# Patient Record
Sex: Male | Born: 1955 | Race: White | Hispanic: No | Marital: Single | Smoking: Former smoker
Health system: Southern US, Community
[De-identification: ages and names within clinical notes are randomized; demographics above are authoritative.]

## PROBLEM LIST (undated history)

## (undated) DIAGNOSIS — G4730 Sleep apnea, unspecified: Secondary | ICD-10-CM

## (undated) DIAGNOSIS — K760 Fatty (change of) liver, not elsewhere classified: Secondary | ICD-10-CM

## (undated) DIAGNOSIS — I10 Essential (primary) hypertension: Secondary | ICD-10-CM

## (undated) DIAGNOSIS — K219 Gastro-esophageal reflux disease without esophagitis: Secondary | ICD-10-CM

## (undated) HISTORY — PX: CHOLECYSTECTOMY: SHX55

---

## 2000-09-23 ENCOUNTER — Encounter: Admission: RE | Admit: 2000-09-23 | Discharge: 2000-09-23 | Payer: Self-pay | Admitting: Internal Medicine

## 2000-09-23 ENCOUNTER — Encounter: Payer: Self-pay | Admitting: Internal Medicine

## 2002-11-30 ENCOUNTER — Emergency Department (HOSPITAL_COMMUNITY): Admission: EM | Admit: 2002-11-30 | Discharge: 2002-11-30 | Payer: Self-pay | Admitting: Emergency Medicine

## 2003-03-29 ENCOUNTER — Encounter (INDEPENDENT_AMBULATORY_CARE_PROVIDER_SITE_OTHER): Payer: Self-pay | Admitting: *Deleted

## 2003-03-29 ENCOUNTER — Ambulatory Visit (HOSPITAL_COMMUNITY): Admission: RE | Admit: 2003-03-29 | Discharge: 2003-03-29 | Payer: Self-pay | Admitting: Gastroenterology

## 2008-01-16 ENCOUNTER — Emergency Department (HOSPITAL_COMMUNITY): Admission: EM | Admit: 2008-01-16 | Discharge: 2008-01-16 | Payer: Self-pay | Admitting: Emergency Medicine
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
## 2010-03-24 ENCOUNTER — Emergency Department (HOSPITAL_COMMUNITY): Admission: EM | Admit: 2010-03-24 | Discharge: 2010-03-24 | Payer: Self-pay | Admitting: Family Medicine

## 2011-03-05 LAB — CBC
HCT: 44 % (ref 39.0–52.0)
Hemoglobin: 15.1 g/dL (ref 13.0–17.0)
MCHC: 34.4 g/dL (ref 30.0–36.0)
MCV: 90.6 fL (ref 78.0–100.0)
Platelets: 255 10*3/uL (ref 150–400)
RBC: 4.86 MIL/uL (ref 4.22–5.81)
RDW: 12.9 % (ref 11.5–15.5)
WBC: 16.6 10*3/uL — ABNORMAL HIGH (ref 4.0–10.5)

## 2011-03-05 LAB — POCT URINALYSIS DIP (DEVICE)
Bilirubin Urine: NEGATIVE
Glucose, UA: NEGATIVE mg/dL
Hgb urine dipstick: NEGATIVE
Ketones, ur: NEGATIVE mg/dL
Nitrite: NEGATIVE
Protein, ur: NEGATIVE mg/dL
Specific Gravity, Urine: 1.015 (ref 1.005–1.030)
Urobilinogen, UA: 0.2 mg/dL (ref 0.0–1.0)
pH: 6 (ref 5.0–8.0)

## 2011-03-05 LAB — POCT I-STAT, CHEM 8
HCT: 47 % (ref 39.0–52.0)
Hemoglobin: 16 g/dL (ref 13.0–17.0)
Potassium: 3.9 mEq/L (ref 3.5–5.1)
Sodium: 137 mEq/L (ref 135–145)
TCO2: 23 mmol/L (ref 0–100)

## 2011-05-02 NOTE — Op Note (Signed)
   NAME:  Samuel Savage, Samuel Savage                         ACCOUNT NO.:  [id number]   MEDICAL RECORD NO.:  [id number]                   PATIENT TYPE:  AMB   LOCATION:  ENDO                                 FACILITY:  [facility]   PHYSICIAN:  Anselmo Rod, M.D.               DATE OF BIRTH:  [birth year]   DATE OF PROCEDURE:  DATE OF DISCHARGE:                                 OPERATIVE REPORT   PROCEDURE:  Colonoscopy with biopsies.   ENDOSCOPIST:  Charna Elizabeth, M.D.   INSTRUMENT USED:  Olympus video colonoscope.   INDICATIONS FOR PROCEDURE:  Rectal bleeding in a 55 year old white male.  Rule out colonic polyps, masses, etc.   PREPROCEDURE PREPARATION:  Informed consent was procured from the patient.  The patient was fasted for eight hours prior to the procedure and prepped  with a bottle of magnesium citrate and GOLYTELY the night prior to the  procedure.   PREPROCEDURE PHYSICAL:  VITAL SIGNS:  The patient had stable vital signs.  NECK:  Supple.  CHEST:  Clear to auscultation.  CARDIAC:  S1 and S2 regular.  ABDOMEN:  Soft with normal bowel sounds.   DESCRIPTION OF PROCEDURE:  The patient was placed in the left lateral  decubitus position and sedated with 100 mg of Demerol and 10 mg of Versed  intravenously.  Once the patient was adequately sedated, maintained on low  flow oxygen and continuous cardiac monitoring, the Olympus video colonoscope  was advanced from the rectum to the cecum without difficulty.  Patchy  erythema was noted in the terminal ileum.  This was biopsied to rule out  Crohn's disease.  Small nonbleeding internal hemorrhoids were seen on  retroflexion.  The rest of the colonic mucosa appeared healthy without  lesions.  No masses, polyps or diverticulosis were noted.   IMPRESSION:  1. Patchy erythema in terminal ileum.  Biopsy to rule out inflammatory bowel     disease.  2. Small nonbleeding internal hemorrhoids.    RECOMMENDATIONS:  1. Anusol HC 2.5% suppositories have  been prescribed for the patient one     p.r. q.h.s. #30 with three refills.  2. Await pathology results.  3. Outpatient followup in the next two weeks for further recommendations.                                                Anselmo Rod, M.D.    JNM/MEDQ  D:  03/29/2003  T:  03/30/2003  Job:  [id number]   cc:   Olene Craven, M.D.  [address]  [address]  [place]  [place] [address]  Fax: [phone]

## 2011-09-18 IMAGING — CR DG ABDOMEN 1V
2 series · 2 of 2 positions shown · non-contrast
Comparison: None.

CLINICAL DATA: Left flank pain.

ABDOMEN - 1 VIEW

[view not recorded (1 of 2)]
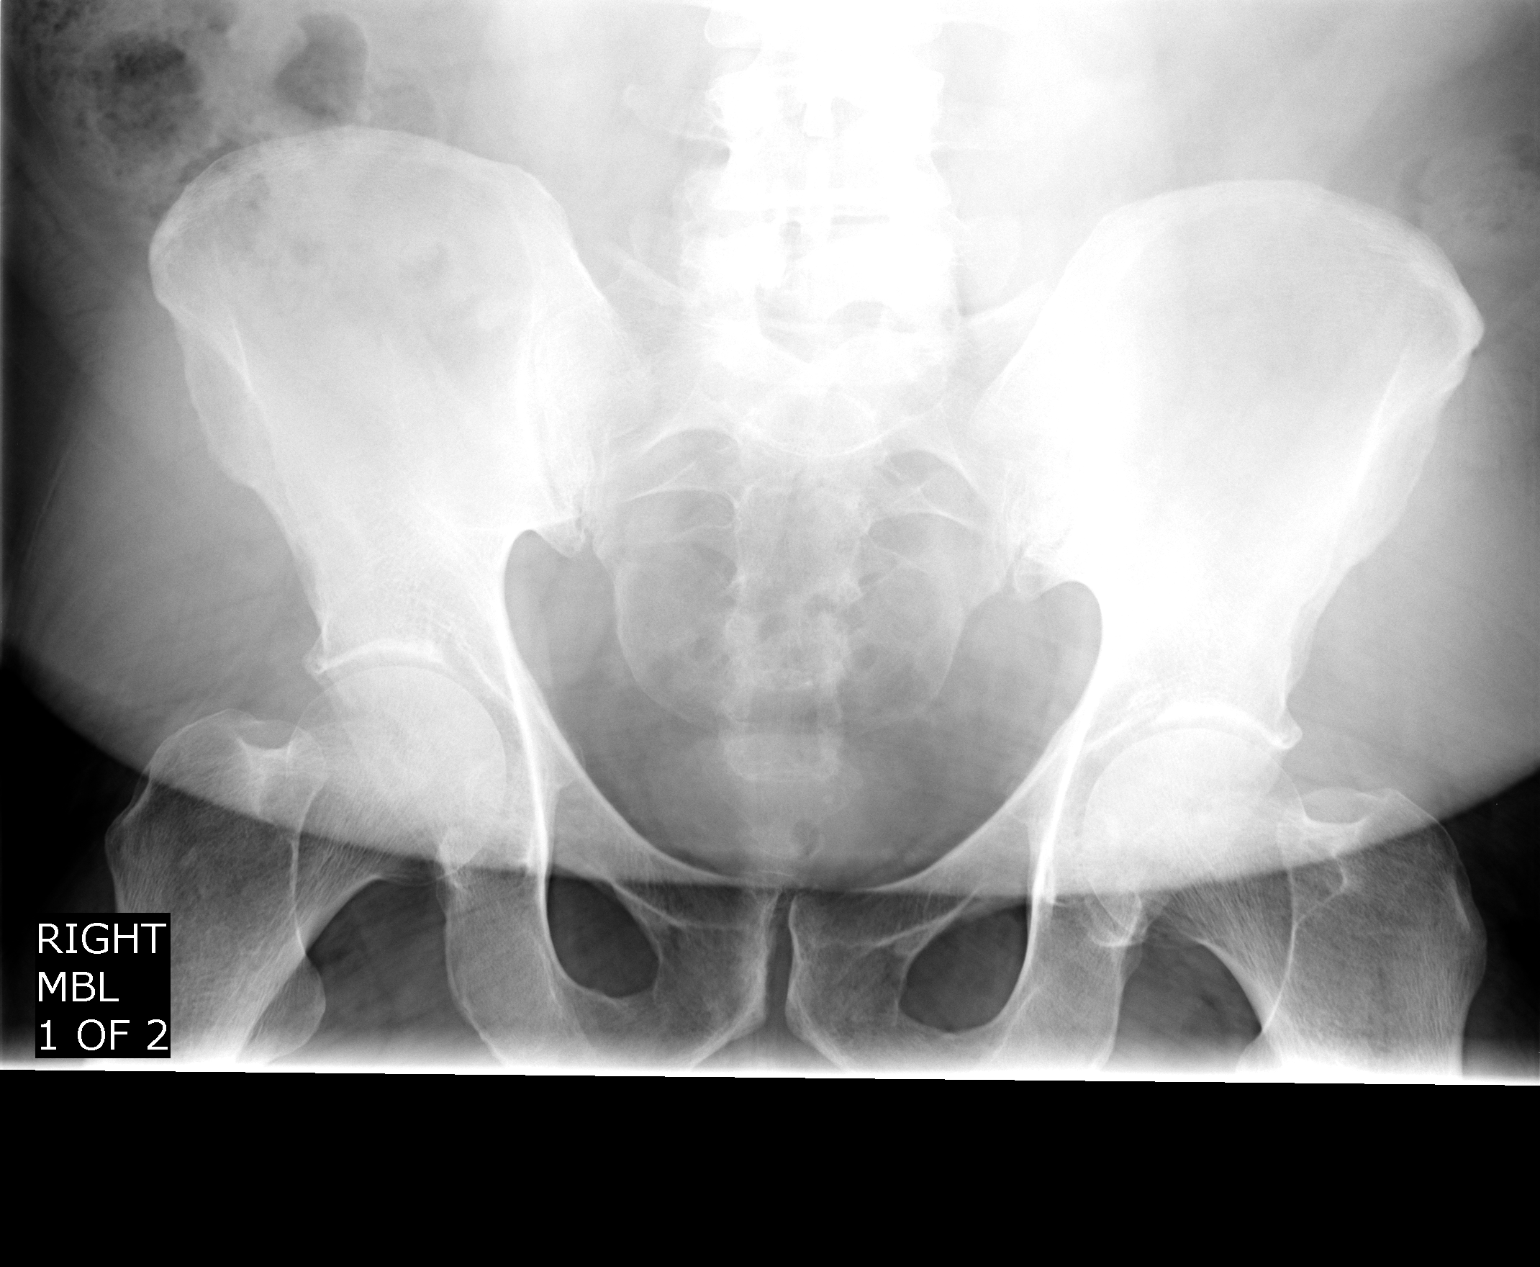

[view not recorded (2 of 2)]
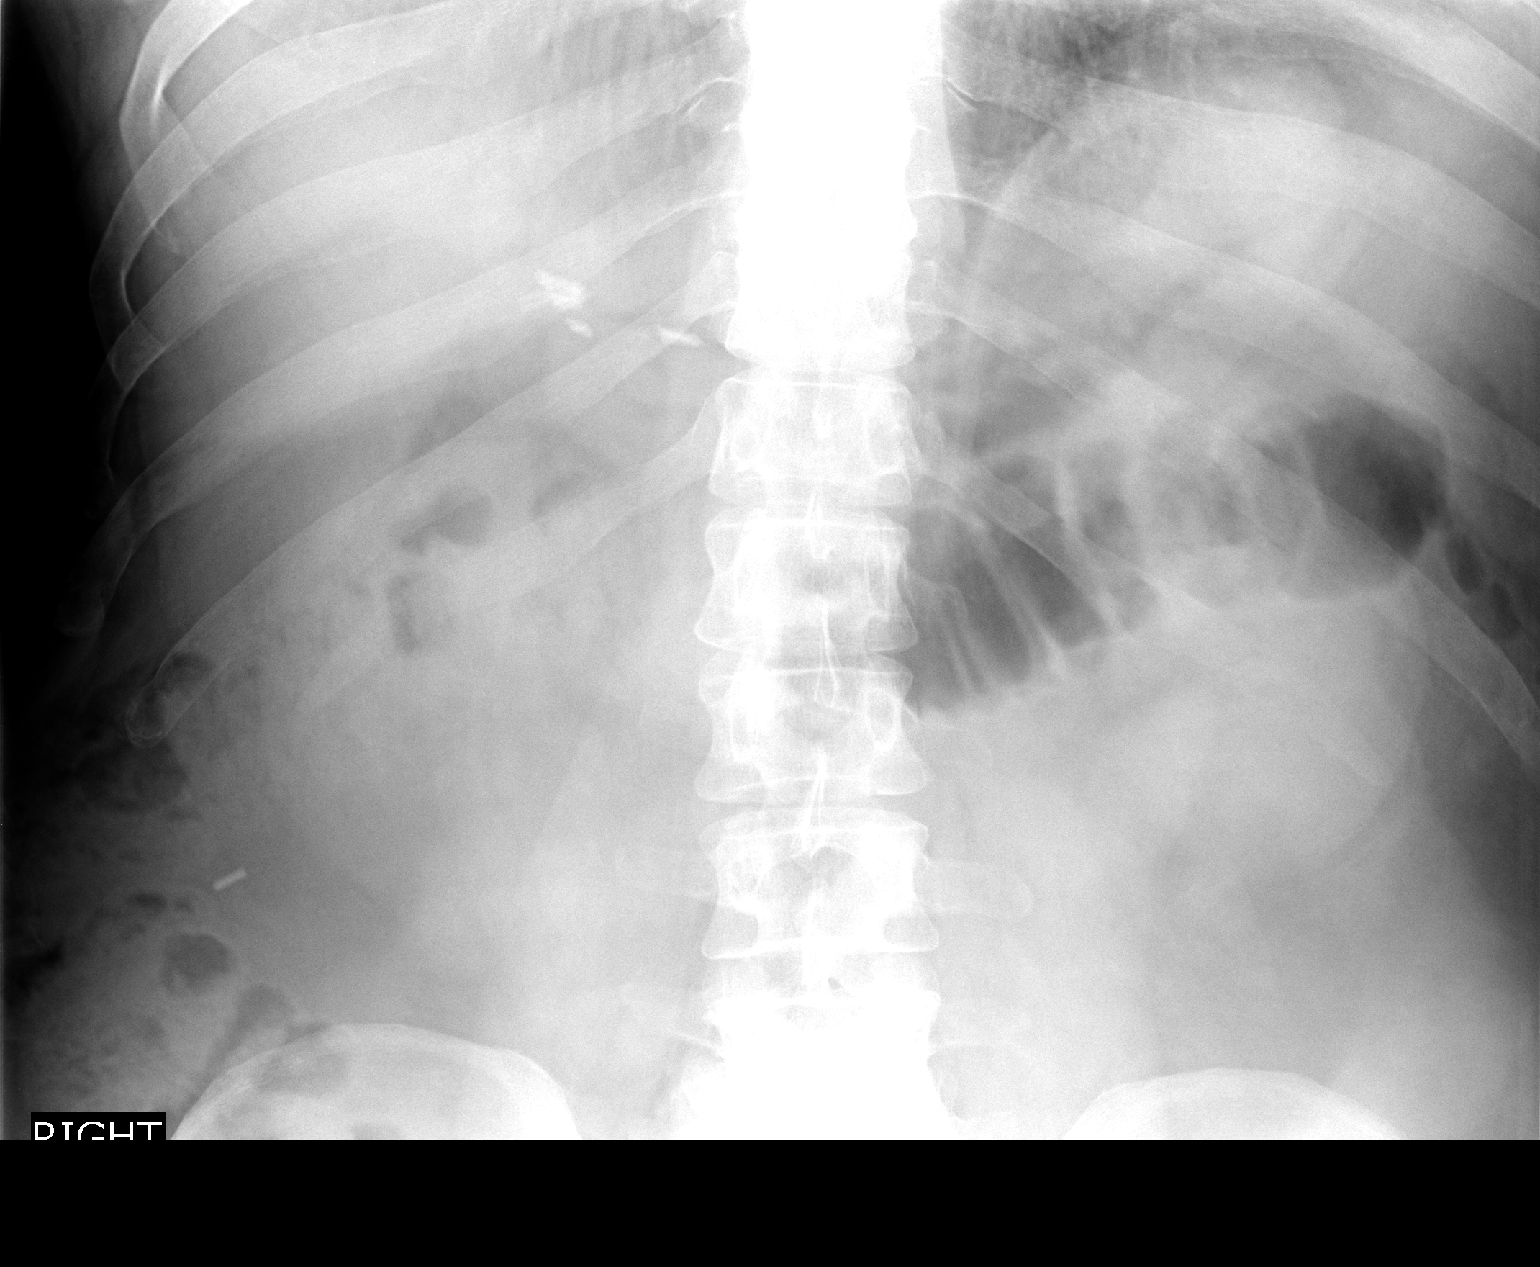

[2 of 2 positions shown; findings below may reference images not displayed]

FINDINGS: Bowel gas pattern is within normal limits.  No radiopaque
calculi identified.  Surgical clips seen from prior
cholecystectomy.
IMPRESSION: No acute findings.

## 2022-08-20 ENCOUNTER — Other Ambulatory Visit: Payer: Self-pay | Admitting: Gastroenterology

## 2022-10-03 ENCOUNTER — Encounter (HOSPITAL_COMMUNITY): Payer: Self-pay | Admitting: Gastroenterology

## 2022-10-10 ENCOUNTER — Encounter (HOSPITAL_COMMUNITY)
Admission: RE | Disposition: A | Discharge status: Home / Self Care | Payer: Self-pay | Source: Ambulatory Visit | Attending: Gastroenterology

## 2022-10-10 ENCOUNTER — Ambulatory Visit (HOSPITAL_BASED_OUTPATIENT_CLINIC_OR_DEPARTMENT_OTHER): Payer: Medicare Other | Admitting: Certified Registered"

## 2022-10-10 ENCOUNTER — Encounter (HOSPITAL_COMMUNITY): Payer: Self-pay | Admitting: Gastroenterology

## 2022-10-10 ENCOUNTER — Ambulatory Visit (HOSPITAL_COMMUNITY): Payer: Medicare Other | Admitting: Certified Registered"

## 2022-10-10 ENCOUNTER — Other Ambulatory Visit: Payer: Self-pay

## 2022-10-10 ENCOUNTER — Ambulatory Visit (HOSPITAL_COMMUNITY)
Admission: RE | Admit: 2022-10-10 | Discharge: 2022-10-10 | Disposition: A | Discharge status: Home / Self Care | Payer: Medicare Other | Source: Ambulatory Visit | Attending: Gastroenterology | Admitting: Gastroenterology

## 2022-10-10 DIAGNOSIS — G4730 Sleep apnea, unspecified: Secondary | ICD-10-CM | POA: Diagnosis not present

## 2022-10-10 DIAGNOSIS — K3189 Other diseases of stomach and duodenum: Secondary | ICD-10-CM | POA: Diagnosis not present

## 2022-10-10 DIAGNOSIS — Z87891 Personal history of nicotine dependence: Secondary | ICD-10-CM | POA: Insufficient documentation

## 2022-10-10 DIAGNOSIS — D509 Iron deficiency anemia, unspecified: Secondary | ICD-10-CM | POA: Diagnosis present

## 2022-10-10 DIAGNOSIS — I10 Essential (primary) hypertension: Secondary | ICD-10-CM | POA: Insufficient documentation

## 2022-10-10 DIAGNOSIS — K31819 Angiodysplasia of stomach and duodenum without bleeding: Secondary | ICD-10-CM

## 2022-10-10 DIAGNOSIS — Z6841 Body Mass Index (BMI) 40.0 and over, adult: Secondary | ICD-10-CM | POA: Diagnosis not present

## 2022-10-10 DIAGNOSIS — K317 Polyp of stomach and duodenum: Secondary | ICD-10-CM

## 2022-10-10 DIAGNOSIS — K5730 Diverticulosis of large intestine without perforation or abscess without bleeding: Secondary | ICD-10-CM | POA: Diagnosis not present

## 2022-10-10 DIAGNOSIS — K219 Gastro-esophageal reflux disease without esophagitis: Secondary | ICD-10-CM | POA: Diagnosis not present

## 2022-10-10 HISTORY — PX: ESOPHAGOGASTRODUODENOSCOPY (EGD) WITH PROPOFOL: SHX5813

## 2022-10-10 HISTORY — DX: Fatty (change of) liver, not elsewhere classified: K76.0

## 2022-10-10 HISTORY — PX: COLONOSCOPY WITH PROPOFOL: SHX5780

## 2022-10-10 HISTORY — DX: Essential (primary) hypertension: I10

## 2022-10-10 HISTORY — PX: BIOPSY: SHX5522

## 2022-10-10 HISTORY — DX: Gastro-esophageal reflux disease without esophagitis: K21.9

## 2022-10-10 HISTORY — DX: Sleep apnea, unspecified: G47.30

## 2022-10-10 HISTORY — PX: POLYPECTOMY: SHX5525

## 2022-10-10 SURGERY — ESOPHAGOGASTRODUODENOSCOPY (EGD) WITH PROPOFOL
Anesthesia: Monitor Anesthesia Care

## 2022-10-10 MED ORDER — PROPOFOL 500 MG/50ML IV EMUL
INTRAVENOUS | Status: DC | PRN
  Administered 2022-10-10: 100 ug/kg/min via INTRAVENOUS

## 2022-10-10 MED ORDER — LACTATED RINGERS IV SOLN
INTRAVENOUS | Status: DC
  Administered 2022-10-10: 1000 mL via INTRAVENOUS

## 2022-10-10 MED ORDER — PROPOFOL 10 MG/ML IV BOLUS
INTRAVENOUS | Status: DC | PRN
  Administered 2022-10-10: 70 mg via INTRAVENOUS

## 2022-10-10 MED ORDER — SODIUM CHLORIDE 0.9 % IV SOLN: INTRAVENOUS | Status: DC

## 2022-10-10 MED ORDER — GLYCOPYRROLATE 0.2 MG/ML IJ SOLN
INTRAMUSCULAR | Status: DC | PRN
  Administered 2022-10-10: .2 mg via INTRAVENOUS

## 2022-10-10 MED ORDER — DEXMEDETOMIDINE HCL IN NACL 200 MCG/50ML IV SOLN
INTRAVENOUS | Status: DC | PRN
  Administered 2022-10-10: 12 ug via INTRAVENOUS

## 2022-10-10 SURGICAL SUPPLY — 25 items

## 2022-10-10 NOTE — Anesthesia Postprocedure Evaluation (Signed)
Anesthesia Post Note  Patient: CARSYN TAUBMAN  Procedure(s) Performed: ESOPHAGOGASTRODUODENOSCOPY (EGD) WITH PROPOFOL COLONOSCOPY WITH PROPOFOL POLYPECTOMY BIOPSY     Patient location during evaluation: PACU Anesthesia Type: MAC Level of consciousness: awake and alert Pain management: pain level controlled Vital Signs Assessment: post-procedure vital signs reviewed and stable Respiratory status: spontaneous breathing, nonlabored ventilation, respiratory function stable and patient connected to nasal cannula oxygen Cardiovascular status: stable and blood pressure returned to baseline Postop Assessment: no apparent nausea or vomiting Anesthetic complications: no   No notable events documented.  Last Vitals:  Vitals:   10/10/22 0820 10/10/22 0830  BP: 120/81 131/78  Pulse: 78 81  Resp: 20 18  Temp:    SpO2: 97% 96%    Last Pain:  Vitals:   10/10/22 0830  TempSrc:   PainSc: 0-No pain                 Hayk Divis S

## 2022-10-10 NOTE — H&P (Signed)
Samuel Savage HPI: With routine blood work he was identified to have an iron deficiency.  His latest HGB was at 9.8 g/dL with an MCV of 73.  His iron saturation was at 8%. Additionally he was recently tested for sleep apnea and he is awaiting the results.  The patient denies any issues with melena, but he has intermittent hematochezia every 6 months.  His last colonoscopy was in 2019 and it only showed a small polyp.  Past Medical History:  Diagnosis Date   Fatty liver    GERD (gastroesophageal reflux disease)    Hypertension    Sleep apnea    cpap    Past Surgical History:  Procedure Laterality Date   CHOLECYSTECTOMY      History reviewed. No pertinent family history.  Social History:  reports that he has quit smoking. His smoking use included cigarettes. He does not have any smokeless tobacco history on file. No history on file for alcohol use and drug use.  Allergies:  Allergies  Allergen Reactions   Pneumococcal Vaccines Swelling    Patient reports swelling in arm with prevnar 13   Ivp Dye [Iodinated Contrast Media] Hives   Poison Ivy Extract Hives and Itching    Medications: Scheduled: Continuous:  sodium chloride     lactated ringers 1,000 mL (10/10/22 0659)    No results found for this or any previous visit (from the past 24 hour(s)).   No results found.  ROS:  As stated above in the HPI otherwise negative.  Blood pressure (!) 157/74, pulse 73, temperature 98.4 F (36.9 C), temperature source Tympanic, resp. rate 14, height 5\' 10"  (1.778 m), weight (!) 137.9 kg, SpO2 97 %.    PE: Gen: NAD, Alert and Oriented HEENT:  [place]/AT, EOMI Neck: Supple, no LAD Lungs: CTA Bilaterally CV: RRR without M/G/R ABD: Soft, NTND, +BS Ext: No C/C/E  Assessment/Plan: 1) IDA - EGD/colonoscopy.  Samuel Savage D 10/10/2022, 7:23 AM

## 2022-10-10 NOTE — Op Note (Signed)
[facility] Patient Name: Samuel Savage Procedure Date: 10/10/2022 MRN: [id number] Attending MD: Carol Ada , MD, [id number] Date of Birth: 02-Jun-1956 CSN: [id number] Age: 66 Admit Type: Outpatient Procedure:                Colonoscopy Indications:              Iron deficiency anemia Providers:                Carol Ada, MD, Dulcy Fanny, Darliss Cheney,                            Technician Referring MD:              Medicines:                Propofol per Anesthesia Complications:            No immediate complications. Estimated Blood Loss:     Estimated blood loss: none. Procedure:                Pre-Anesthesia Assessment:                           - Prior to the procedure, a History and Physical                            was performed, and patient medications and                            allergies were reviewed. The patient's tolerance of                            previous anesthesia was also reviewed. The risks                            and benefits of the procedure and the sedation                            options and risks were discussed with the patient.                            All questions were answered, and informed consent                            was obtained. Prior Anticoagulants: The patient has                            taken no anticoagulant or antiplatelet agents. ASA                            Grade Assessment: III - A patient with severe                            systemic disease. After reviewing the risks and                            benefits, the  patient was deemed in satisfactory                            condition to undergo the procedure.                           - Sedation was administered by an anesthesia                            professional. Deep sedation was attained.                           After obtaining informed consent, the colonoscope                            was passed under direct vision. Throughout the                             procedure, the patient's blood pressure, pulse, and                            oxygen saturations were monitored continuously. The                            CF-HQ190L [id number]) Olympus colonoscope was                            introduced through the anus and advanced to the the                            cecum, identified by appendiceal orifice and                            ileocecal valve. The colonoscopy was performed                            without difficulty. The patient tolerated the                            procedure well. The quality of the bowel                            preparation was evaluated using the BBPS [facility]                            Bowel Preparation Scale) with scores of: Right                            Colon = 3 (entire mucosa seen well with no residual                            staining, small fragments of stool or opaque  liquid), Transverse Colon = 3 (entire mucosa seen                            well with no residual staining, small fragments of                            stool or opaque liquid) and Left Colon = 3 (entire                            mucosa seen well with no residual staining, small                            fragments of stool or opaque liquid). The total                            BBPS score equals 9. The quality of the bowel                            preparation was good. The ileocecal valve,                            appendiceal orifice, and rectum were photographed. Scope In: 7:54:02 AM Scope Out: 8:04:02 AM Scope Withdrawal Time: 0 hours 8 minutes 40 seconds  Total Procedure Duration: 0 hours 10 minutes 0 seconds  Findings:      Scattered medium-mouthed diverticula were found in the sigmoid colon. Impression:               - Diverticulosis in the sigmoid colon.                           - No specimens collected. Moderate Sedation:      Not Applicable - Patient had care per  Anesthesia. Recommendation:           - Patient has a contact number available for                            emergencies. The signs and symptoms of potential                            delayed complications were discussed with the                            patient. Return to normal activities tomorrow.                            Written discharge instructions were provided to the                            patient.                           - Resume previous diet.                           -  Continue present medications.                           - Repeat colonoscopy is not recommended for                            surveillance. Procedure Code(s):        --- Professional ---                           [phone], Colonoscopy, flexible; diagnostic, including                            collection of specimen(s) by brushing or washing,                            when performed (separate procedure) Diagnosis Code(s):        --- Professional ---                           D50.9, Iron deficiency anemia, unspecified                           K57.30, Diverticulosis of large intestine without                            perforation or abscess without bleeding CPT copyright 2022 American Medical Association. All rights reserved. The codes documented in this report are preliminary and upon coder review may  be revised to meet current compliance requirements. Carol Ada, MD Carol Ada, MD 10/10/2022 8:11:50 AM This report has been signed electronically. Number of Addenda: 0

## 2022-10-10 NOTE — Transfer of Care (Signed)
Immediate Anesthesia Transfer of Care Note  Patient: Samuel Savage  Procedure(s) Performed: ESOPHAGOGASTRODUODENOSCOPY (EGD) WITH PROPOFOL COLONOSCOPY WITH PROPOFOL POLYPECTOMY BIOPSY  Patient Location: PACU  Anesthesia Type:MAC  Level of Consciousness: awake, alert , oriented and patient cooperative  Airway & Oxygen Therapy: Patient Spontanous Breathing and Patient connected to face mask oxygen  Post-op Assessment: Report given to RN and Post -op Vital signs reviewed and stable  Post vital signs: Reviewed and stable  Last Vitals:  Vitals Value Taken Time  BP    Temp    Pulse 86 10/10/22 0811  Resp 17 10/10/22 0811  SpO2 99 % 10/10/22 0811  Vitals shown include unvalidated device data.  Last Pain:  Vitals:   10/10/22 0640  TempSrc: Tympanic  PainSc: 0-No pain         Complications: No notable events documented.

## 2022-10-10 NOTE — Discharge Instructions (Signed)

## 2022-10-10 NOTE — Op Note (Signed)
[facility] Patient Name: Samuel Savage Procedure Date: 10/10/2022 MRN: [id number] Attending MD: Jeani Hawking , MD, [id number] Date of Birth: 02/17/1956 CSN: [id number] Age: 66 Admit Type: Outpatient Procedure:                Upper GI endoscopy Indications:              Iron deficiency anemia Providers:                Jeani Hawking, MD, Fransisca Connors, Irene Shipper,                            Technician Referring MD:              Medicines:                 Complications:            No immediate complications. Estimated Blood Loss:     Estimated blood loss: none. Estimated blood loss                            was minimal. Procedure:                Pre-Anesthesia Assessment:                           - Prior to the procedure, a History and Physical                            was performed, and patient medications and                            allergies were reviewed. The patient's tolerance of                            previous anesthesia was also reviewed. The risks                            and benefits of the procedure and the sedation                            options and risks were discussed with the patient.                            All questions were answered, and informed consent                            was obtained. Prior Anticoagulants: The patient has                            taken no anticoagulant or antiplatelet agents. ASA                            Grade Assessment: III - A patient with severe                            systemic disease. After reviewing  the risks and                            benefits, the patient was deemed in satisfactory                            condition to undergo the procedure.                           - Sedation was administered by an anesthesia                            professional. Deep sedation was attained.                           After obtaining informed consent, the endoscope was                             passed under direct vision. Throughout the                            procedure, the patient's blood pressure, pulse, and                            oxygen saturations were monitored continuously. The                            CF-HQ190L ([id number]) Olympus colonoscope was                            introduced through the mouth, and advanced to the                            second part of duodenum. The upper GI endoscopy was                            accomplished without difficulty. The patient                            tolerated the procedure well. Scope In: Scope Out: Findings:      The esophagus was normal.      A few small sessile polyps with bleeding and stigmata of recent bleeding       were found in the gastric fundus. The polyp was removed with a hot       snare. Resection and retrieval were complete.      Diffuse moderately erythematous mucosa without bleeding was found in the       gastric fundus. Biopsies were taken with a cold forceps for histology.      The examined duodenum was normal.      In the fundus of the stomach there was evidence of hematin. A small       bleeding polyp was identified and this was treated with a hot snare. A       second polyp was removd as there was associated hematin. The erythema       was consistent  with telangectasias, but it was not completely certain.       Cold biopsies were obtained to evaluate for any inflammation. 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 Impression:               - Normal esophagus.                           - A few gastric polyps. Resected and retrieved.                           - Erythematous mucosa in the gastric fundus.                            Biopsied.                           - Normal examined duodenum. Moderate Sedation:      Not Applicable - Patient had care per Anesthesia. Recommendation:           - Patient has a contact number available for                             emergencies. The signs and symptoms of potential                            delayed complications were discussed with the                            patient. Return to normal activities tomorrow.                            Written discharge instructions were provided to the                            patient.                           - Resume previous diet.                           - Continue present medications.                           - Await pathology results. Procedure Code(s):        --- Professional ---                           [phone], Esophagogastroduodenoscopy, flexible,                            transoral; with removal of tumor(s), polyp(s), or                            other lesion(s) by snare technique  [id number], 59, Esophagogastroduodenoscopy, flexible,                            transoral; with biopsy, single or multiple Diagnosis Code(s):        --- Professional ---                           K31.7, Polyp of stomach and duodenum                           K31.89, Other diseases of stomach and duodenum                           D50.9, Iron deficiency anemia, unspecified CPT copyright 2022 American Medical Association. All rights reserved. The codes documented in this report are preliminary and upon coder review may  be revised to meet current compliance requirements. Jeani Hawking, MD Jeani Hawking, MD 10/10/2022 8:16:31 AM This report has been signed electronically. Number of Addenda: 0

## 2022-10-10 NOTE — Anesthesia Preprocedure Evaluation (Signed)
Anesthesia Evaluation  Patient identified by MRN, date of birth, ID band Patient awake    Reviewed: Allergy & Precautions, NPO status , Patient's Chart, lab work & pertinent test results  Airway Mallampati: III  TM Distance: <3 FB Neck ROM: Full    Dental no notable dental hx.    Pulmonary sleep apnea and Continuous Positive Airway Pressure Ventilation , former smoker,    breath sounds clear to auscultation + decreased breath sounds      Cardiovascular hypertension, Pt. on medications Normal cardiovascular exam Rhythm:Regular Rate:Normal     Neuro/Psych negative neurological ROS  negative psych ROS   GI/Hepatic Neg liver ROS, GERD  ,  Endo/Other  Morbid obesity  Renal/GU negative Renal ROS  negative genitourinary   Musculoskeletal negative musculoskeletal ROS (+)   Abdominal (+) + obese,   Peds negative pediatric ROS (+)  Hematology negative hematology ROS (+)   Anesthesia Other Findings   Reproductive/Obstetrics negative OB ROS                             Anesthesia Physical Anesthesia Plan  ASA: 3  Anesthesia Plan: MAC   Post-op Pain Management: Minimal or no pain anticipated   Induction: Intravenous  PONV Risk Score and Plan: 1 and Propofol infusion and Treatment may vary due to age or medical condition  Airway Management Planned: Simple Face Mask  Additional Equipment:   Intra-op Plan:   Post-operative Plan:   Informed Consent: I have reviewed the patients History and Physical, chart, labs and discussed the procedure including the risks, benefits and alternatives for the proposed anesthesia with the patient or authorized representative who has indicated his/her understanding and acceptance.     Dental advisory given  Plan Discussed with: CRNA and Surgeon  Anesthesia Plan Comments:         Anesthesia Quick Evaluation

## 2022-10-12 ENCOUNTER — Encounter (HOSPITAL_COMMUNITY): Payer: Self-pay | Admitting: Gastroenterology

## 2022-10-13 LAB — SURGICAL PATHOLOGY

## 2024-04-15 ENCOUNTER — Ambulatory Visit (INDEPENDENT_AMBULATORY_CARE_PROVIDER_SITE_OTHER): Payer: Self-pay | Admitting: Podiatry

## 2024-04-15 ENCOUNTER — Encounter: Payer: Self-pay | Admitting: Podiatry

## 2024-04-15 DIAGNOSIS — B351 Tinea unguium: Secondary | ICD-10-CM

## 2024-04-15 DIAGNOSIS — M79675 Pain in left toe(s): Secondary | ICD-10-CM

## 2024-04-15 NOTE — Progress Notes (Signed)
 Chief Complaint  Patient presents with   Nail Problem    "I can't get rid of this Athlete's Feet.  I have nail fungus."   N - athlete's feet and fungal nails L - bilateral, toenails - hallux bilateral and 5th left toenail D - since I was 68 yrs old on and off - athlete's feet; toenail - 4 mos O - slowly worse C - cracks in between toes 4-5 blateral, thick, discolored A - none T - [employer] Foot Cream                                                         HPI: 68 y.o. male presenting today as a new patient for concern of his left great toenail.  Patient states that about 3 weeks ago he noticed some sensitivity with discoloration to the left toe nail.  Since that time he has been applying Gold[employer] foot cream which has helped alleviate his symptoms.  He no longer has any symptoms associated to the toe but he would like to have it evaluated  Past Medical History:  Diagnosis Date   Fatty liver    GERD (gastroesophageal reflux disease)    Hypertension    Sleep apnea    cpap    Past Surgical History:  Procedure Laterality Date   BIOPSY  10/10/2022   Procedure: BIOPSY;  Surgeon: Alvis Jourdain, MD;  Location: [facility];  Service: Gastroenterology;;   CHOLECYSTECTOMY     COLONOSCOPY WITH PROPOFOL  N/A 10/10/2022   Procedure: COLONOSCOPY WITH PROPOFOL ;  Surgeon: Alvis Jourdain, MD;  Location: [facility];  Service: Gastroenterology;  Laterality: N/A;   ESOPHAGOGASTRODUODENOSCOPY (EGD) WITH PROPOFOL  N/A 10/10/2022   Procedure: ESOPHAGOGASTRODUODENOSCOPY (EGD) WITH PROPOFOL ;  Surgeon: Alvis Jourdain, MD;  Location: [facility];  Service: Gastroenterology;  Laterality: N/A;   POLYPECTOMY  10/10/2022   Procedure: POLYPECTOMY;  Surgeon: Alvis Jourdain, MD;  Location: [facility];  Service: Gastroenterology;;    Allergies  Allergen Reactions   Pneumococcal Vaccine Swelling    Patient reports swelling in arm with prevnar 13   Pneumococcal Vaccines Swelling    Patient reports swelling in  arm with prevnar 13   Ivp Dye [Iodinated Contrast Media] Hives   Naltrexone Other (See Comments)   Poison Ivy Extract Hives and Itching   Topiramate     Blurry vision     Physical Exam: General: The patient is alert and oriented x3 in no acute distress.  Dermatology: Skin is warm, dry and supple bilateral lower extremities.  Chronic hyperkeratotic dystrophic nails noted to the bilateral great toes consistent with chronic onychomycosis of the toenail which appear stable.  Nontender  Vascular: Palpable pedal pulses bilaterally. Capillary refill within normal limits.  No appreciable edema.  No erythema.  Neurological: Grossly intact via light touch  Musculoskeletal Exam: No pedal deformities noted  Assessment/Plan of Care: 1.  Discoloration with sensitivity to the left great toenail plate x 3 weeks; currently asymptomatic  -Patient applied the Goldbond foot cream which seem to alleviate his symptoms.  He no longer has any symptoms or pain to the foot -Continue to maintain good foot hygiene.  Advise against going barefoot.  He rotors good supportive tennis shoes and sneakers -Return to clinic as needed       Dot Gazella, DPM [facility]  Dr. Dot Gazella, DPM    [address] [place], [place] [address]                Office [phone]  Fax [phone]
